# Patient Record
Sex: Male | Born: 2000
Health system: Southern US, Community
[De-identification: ages and names within clinical notes are randomized; demographics above are authoritative.]

## PROBLEM LIST (undated history)

## (undated) DIAGNOSIS — F419 Anxiety disorder, unspecified: Secondary | ICD-10-CM

## (undated) DIAGNOSIS — S82899A Other fracture of unspecified lower leg, initial encounter for closed fracture: Secondary | ICD-10-CM

## (undated) DIAGNOSIS — H539 Unspecified visual disturbance: Secondary | ICD-10-CM

## (undated) DIAGNOSIS — F909 Attention-deficit hyperactivity disorder, unspecified type: Secondary | ICD-10-CM

## (undated) HISTORY — PX: ANKLE FRACTURE SURGERY: SHX122

---
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
## 2021-05-15 ENCOUNTER — Emergency Department: Payer: Self-pay

## 2021-05-15 ENCOUNTER — Other Ambulatory Visit: Payer: Self-pay

## 2021-05-15 ENCOUNTER — Emergency Department
Admission: EM | Admit: 2021-05-15 | Discharge: 2021-05-15 | Disposition: A | Discharge status: Home / Self Care | Payer: Self-pay | Attending: Student in an Organized Health Care Education/Training Program | Admitting: Student in an Organized Health Care Education/Training Program

## 2021-05-15 DIAGNOSIS — R0789 Other chest pain: Secondary | ICD-10-CM | POA: Insufficient documentation

## 2021-05-15 DIAGNOSIS — R519 Headache, unspecified: Secondary | ICD-10-CM | POA: Diagnosis present

## 2021-05-15 DIAGNOSIS — Y92410 Unspecified street and highway as the place of occurrence of the external cause: Secondary | ICD-10-CM | POA: Insufficient documentation

## 2021-05-15 MED ORDER — MELOXICAM 15 MG PO TABS: 15.0000 mg | ORAL_TABLET | Freq: Every day | ORAL | 2 refills | Status: AC

## 2021-05-15 MED ORDER — KETOROLAC TROMETHAMINE 30 MG/ML IJ SOLN
30.0000 mg | Freq: Once | INTRAMUSCULAR | Status: AC
  Administered 2021-05-15: 30 mg via INTRAMUSCULAR
  Filled 2021-05-15: qty 1

## 2021-05-15 NOTE — Discharge Instructions (Signed)
Take Meloxicam once daily for pain and inflammation.  

## 2021-05-15 NOTE — ED Notes (Signed)
Signature pad not working, verbal consent for d/c obtained by this Char[employer] fundraiser. ?

## 2021-05-15 NOTE — ED Notes (Signed)
Orders received from dr. Roxan Hockey ?
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
